# Patient Record
Sex: Female | Born: 1980 | Race: White | Hispanic: No | Marital: Single | Smoking: Current every day smoker
Health system: Southern US, Community
[De-identification: ages and names within clinical notes are randomized; demographics above are authoritative.]

## PROBLEM LIST (undated history)

## (undated) DIAGNOSIS — F32A Depression, unspecified: Secondary | ICD-10-CM

## (undated) DIAGNOSIS — F329 Major depressive disorder, single episode, unspecified: Secondary | ICD-10-CM

## (undated) DIAGNOSIS — M329 Systemic lupus erythematosus, unspecified: Secondary | ICD-10-CM

## (undated) DIAGNOSIS — F4310 Post-traumatic stress disorder, unspecified: Secondary | ICD-10-CM

## (undated) DIAGNOSIS — N3010 Interstitial cystitis (chronic) without hematuria: Secondary | ICD-10-CM

## (undated) DIAGNOSIS — IMO0002 Reserved for concepts with insufficient information to code with codable children: Secondary | ICD-10-CM

## (undated) DIAGNOSIS — F419 Anxiety disorder, unspecified: Secondary | ICD-10-CM

## (undated) HISTORY — DX: Major depressive disorder, single episode, unspecified: F32.9

## (undated) HISTORY — DX: Depression, unspecified: F32.A

## (undated) HISTORY — DX: Interstitial cystitis (chronic) without hematuria: N30.10

## (undated) HISTORY — DX: Post-traumatic stress disorder, unspecified: F43.10

## (undated) HISTORY — DX: Anxiety disorder, unspecified: F41.9

---

## 1999-04-09 ENCOUNTER — Inpatient Hospital Stay (HOSPITAL_COMMUNITY): Admission: AD | Admit: 1999-04-09 | Discharge: 1999-04-09 | Payer: Self-pay | Admitting: *Deleted

## 1999-05-25 ENCOUNTER — Emergency Department (HOSPITAL_COMMUNITY): Admission: EM | Admit: 1999-05-25 | Discharge: 1999-05-25 | Payer: Self-pay | Admitting: Emergency Medicine

## 1999-07-21 ENCOUNTER — Inpatient Hospital Stay (HOSPITAL_COMMUNITY): Admission: AD | Admit: 1999-07-21 | Discharge: 1999-07-21 | Payer: Self-pay | Admitting: Obstetrics & Gynecology

## 1999-08-21 ENCOUNTER — Other Ambulatory Visit: Admission: RE | Admit: 1999-08-21 | Discharge: 1999-08-21 | Payer: Self-pay | Admitting: Obstetrics and Gynecology

## 1999-09-16 ENCOUNTER — Inpatient Hospital Stay (HOSPITAL_COMMUNITY): Admission: AD | Admit: 1999-09-16 | Discharge: 1999-09-16 | Payer: Self-pay | Admitting: Obstetrics and Gynecology

## 1999-10-22 ENCOUNTER — Inpatient Hospital Stay (HOSPITAL_COMMUNITY): Admission: AD | Admit: 1999-10-22 | Discharge: 1999-10-26 | Payer: Self-pay | Admitting: Obstetrics and Gynecology

## 2005-08-18 DIAGNOSIS — N3010 Interstitial cystitis (chronic) without hematuria: Secondary | ICD-10-CM

## 2005-08-18 HISTORY — PX: TOTAL VAGINAL HYSTERECTOMY: SHX2548

## 2005-08-18 HISTORY — DX: Interstitial cystitis (chronic) without hematuria: N30.10

## 2012-10-14 ENCOUNTER — Inpatient Hospital Stay: Payer: Self-pay | Admitting: Psychiatry

## 2012-10-14 LAB — DRUG SCREEN, URINE
Amphetamines, Ur Screen: NEGATIVE (ref ?–1000)
Cannabinoid 50 Ng, Ur ~~LOC~~: NEGATIVE (ref ?–50)
Opiate, Ur Screen: NEGATIVE (ref ?–300)
Phencyclidine (PCP) Ur S: NEGATIVE (ref ?–25)

## 2012-10-14 LAB — URINALYSIS, COMPLETE
Bilirubin,UR: NEGATIVE
Blood: NEGATIVE
Ketone: NEGATIVE
Leukocyte Esterase: NEGATIVE
Nitrite: NEGATIVE
Ph: 7 (ref 4.5–8.0)
Protein: NEGATIVE
RBC,UR: 1 /HPF (ref 0–5)
Specific Gravity: 1.004 (ref 1.003–1.030)
Squamous Epithelial: 3
WBC UR: 1 /HPF (ref 0–5)

## 2012-10-14 LAB — COMPREHENSIVE METABOLIC PANEL
Albumin: 4.3 g/dL (ref 3.4–5.0)
Anion Gap: 4 — ABNORMAL LOW (ref 7–16)
BUN: 8 mg/dL (ref 7–18)
Bilirubin,Total: 0.9 mg/dL (ref 0.2–1.0)
Calcium, Total: 9 mg/dL (ref 8.5–10.1)
Co2: 29 mmol/L (ref 21–32)
Creatinine: 0.64 mg/dL (ref 0.60–1.30)
EGFR (African American): >60
Glucose: 90 mg/dL (ref 65–99)
Potassium: 4.3 mmol/L (ref 3.5–5.1)
SGOT(AST): 27 U/L (ref 15–37)
Sodium: 136 mmol/L (ref 136–145)

## 2012-10-14 LAB — ACETAMINOPHEN LEVEL: Acetaminophen: <2 ug/mL

## 2012-10-14 LAB — CBC
MCH: 31.7 pg (ref 26.0–34.0)
MCHC: 34.4 g/dL (ref 32.0–36.0)
MCV: 92 fL (ref 80–100)
Platelet: 308 10*3/uL (ref 150–440)
RDW: 12.8 % (ref 11.5–14.5)

## 2012-10-14 LAB — ETHANOL
Ethanol %: <0.003 % (ref 0.000–0.080)
Ethanol: <3 mg/dL

## 2012-10-14 LAB — TSH: Thyroid Stimulating Horm: 2.97 u[IU]/mL

## 2013-01-22 ENCOUNTER — Emergency Department: Payer: Self-pay | Admitting: Emergency Medicine

## 2013-02-08 LAB — COMPREHENSIVE METABOLIC PANEL
Albumin: 3.9 g/dL (ref 3.4–5.0)
Anion Gap: 6 — ABNORMAL LOW (ref 7–16)
BUN: 7 mg/dL (ref 7–18)
Bilirubin,Total: 1 mg/dL (ref 0.2–1.0)
Calcium, Total: 9.2 mg/dL (ref 8.5–10.1)
Chloride: 105 mmol/L (ref 98–107)
Creatinine: 0.64 mg/dL (ref 0.60–1.30)
EGFR (Non-African Amer.): >60
Glucose: 78 mg/dL (ref 65–99)
Osmolality: 274 (ref 275–301)
SGOT(AST): 39 U/L — ABNORMAL HIGH (ref 15–37)
SGPT (ALT): 45 U/L (ref 12–78)
Sodium: 139 mmol/L (ref 136–145)

## 2013-02-08 LAB — ETHANOL
Ethanol %: <0.003 % (ref 0.000–0.080)
Ethanol: <3 mg/dL

## 2013-02-08 LAB — CBC
HCT: 43 % (ref 35.0–47.0)
HGB: 15.3 g/dL (ref 12.0–16.0)
MCH: 32.6 pg (ref 26.0–34.0)
MCHC: 35.6 g/dL (ref 32.0–36.0)
MCV: 92 fL (ref 80–100)
Platelet: 263 10*3/uL (ref 150–440)
RBC: 4.68 10*6/uL (ref 3.80–5.20)
RDW: 12.4 % (ref 11.5–14.5)
WBC: 10.1 10*3/uL (ref 3.6–11.0)

## 2013-02-09 LAB — URINALYSIS, COMPLETE
Bilirubin,UR: NEGATIVE
Blood: NEGATIVE
Glucose,UR: NEGATIVE mg/dL (ref 0–75)
Leukocyte Esterase: NEGATIVE
Nitrite: NEGATIVE
Ph: 6 (ref 4.5–8.0)
Protein: NEGATIVE

## 2013-02-09 LAB — DRUG SCREEN, URINE
Barbiturates, Ur Screen: NEGATIVE (ref ?–200)
Benzodiazepine, Ur Scrn: POSITIVE (ref ?–200)
Cannabinoid 50 Ng, Ur ~~LOC~~: NEGATIVE (ref ?–50)
MDMA (Ecstasy)Ur Screen: NEGATIVE (ref ?–500)
Methadone, Ur Screen: NEGATIVE (ref ?–300)

## 2013-02-10 ENCOUNTER — Inpatient Hospital Stay: Payer: Self-pay | Admitting: Psychiatry

## 2013-02-10 LAB — BEHAVIORAL MEDICINE 1 PANEL
Basophil %: 0.4 %
Calcium, Total: 9.3 mg/dL (ref 8.5–10.1)
Co2: 29 mmol/L (ref 21–32)
Creatinine: 0.62 mg/dL (ref 0.60–1.30)
EGFR (Non-African Amer.): >60
Eosinophil #: 0.2 10*3/uL (ref 0.0–0.7)
HGB: 16 g/dL (ref 12.0–16.0)
Lymphocyte #: 1.6 10*3/uL (ref 1.0–3.6)
Lymphocyte %: 23.8 %
MCH: 32.6 pg (ref 26.0–34.0)
MCHC: 35.3 g/dL (ref 32.0–36.0)
MCV: 92 fL (ref 80–100)
Monocyte %: 5.9 %
Neutrophil %: 67.5 %
RBC: 4.91 10*6/uL (ref 3.80–5.20)
SGPT (ALT): 37 U/L (ref 12–78)
Sodium: 140 mmol/L (ref 136–145)
Total Protein: 7.8 g/dL (ref 6.4–8.2)

## 2014-11-24 ENCOUNTER — Emergency Department (HOSPITAL_COMMUNITY)
Admission: EM | Admit: 2014-11-24 | Discharge: 2014-11-24 | Disposition: A | Discharge status: Home / Self Care | Payer: Self-pay | Attending: Emergency Medicine | Admitting: Emergency Medicine

## 2014-11-24 ENCOUNTER — Encounter (HOSPITAL_COMMUNITY): Payer: Self-pay | Admitting: Emergency Medicine

## 2014-11-24 DIAGNOSIS — R51 Headache: Secondary | ICD-10-CM | POA: Insufficient documentation

## 2014-11-24 DIAGNOSIS — Y92481 Parking lot as the place of occurrence of the external cause: Secondary | ICD-10-CM | POA: Insufficient documentation

## 2014-11-24 DIAGNOSIS — R519 Headache, unspecified: Secondary | ICD-10-CM

## 2014-11-24 DIAGNOSIS — Y9389 Activity, other specified: Secondary | ICD-10-CM | POA: Insufficient documentation

## 2014-11-24 DIAGNOSIS — M25512 Pain in left shoulder: Secondary | ICD-10-CM

## 2014-11-24 DIAGNOSIS — H53149 Visual discomfort, unspecified: Secondary | ICD-10-CM | POA: Insufficient documentation

## 2014-11-24 DIAGNOSIS — S4992XA Unspecified injury of left shoulder and upper arm, initial encounter: Secondary | ICD-10-CM | POA: Insufficient documentation

## 2014-11-24 DIAGNOSIS — Y998 Other external cause status: Secondary | ICD-10-CM | POA: Insufficient documentation

## 2014-11-24 DIAGNOSIS — S199XXA Unspecified injury of neck, initial encounter: Secondary | ICD-10-CM | POA: Insufficient documentation

## 2014-11-24 DIAGNOSIS — M542 Cervicalgia: Secondary | ICD-10-CM

## 2014-11-24 MED ORDER — KETOROLAC TROMETHAMINE 60 MG/2ML IM SOLN
60.0000 mg | Freq: Once | INTRAMUSCULAR | Status: AC
  Administered 2014-11-24: 60 mg via INTRAMUSCULAR
  Filled 2014-11-24: qty 2
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
## 2014-11-24 NOTE — Discharge Instructions (Signed)
Return to the emergency room with worsening of symptoms, new symptoms or with symptoms that are concerning , especially severe worsening of headache, visual or speech changes, weakness in face, arms or legs. RICE: Rest, Ice (three cycles of 20 mins on, off at least twice a day), compression/brace, elevation. Heating pad works well for back pain. Ibuprofen  (2 tablets ) every 5-6 hours for 3-5 days. Follow up with PCP if symptoms worsen or are persistent. Read below information and follow recommendations. Concussion A concussion, or closed-head injury, is a brain injury caused by a direct blow to the head or by a quick and sudden movement (jolt) of the head or neck. Concussions are usually not life-threatening. Even so, the effects of a concussion can be serious. If you have had a concussion before, you are more likely to experience concussion-like symptoms after a direct blow to the head.  CAUSES  Direct blow to the head, such as from running into another player during a soccer game, being hit in a fight, or hitting your head on a hard surface.  A jolt of the head or neck that causes the brain to move back and forth inside the skull, such as in a car crash. SIGNS AND SYMPTOMS The signs of a concussion can be hard to notice. Early on, they may be missed by you, family members, and health care providers. You may look fine but act or feel differently. Symptoms are usually temporary, but they may last for days, weeks, or even longer. Some symptoms may appear right away while others may not show up for hours or days. Every head injury is different. Symptoms include:  Mild to moderate headaches that will not go away.  A feeling of pressure inside your head.  Having more trouble than usual:  Learning or remembering things you have heard.  Answering questions.  Paying attention or concentrating.  Organizing daily tasks.  Making decisions and solving problems.  Slowness in  thinking, acting or reacting, speaking, or reading.  Getting lost or being easily confused.  Feeling tired all the time or lacking energy (fatigued).  Feeling drowsy.  Sleep disturbances.  Sleeping more than usual.  Sleeping less than usual.  Trouble falling asleep.  Trouble sleeping (insomnia).  Loss of balance or feeling lightheaded or dizzy.  Nausea or vomiting.  Numbness or tingling.  Increased sensitivity to:  Sounds.  Lights.  Distractions.  Vision problems or eyes that tire easily.  Diminished sense of taste or smell.  Ringing in the ears.  Mood changes such as feeling sad or anxious.  Becoming easily irritated or angry for little or no reason.  Lack of motivation.  Seeing or hearing things other people do not see or hear (hallucinations). DIAGNOSIS Your health care provider can usually diagnose a concussion based on a description of your injury and symptoms. He or she will ask whether you passed out (lost consciousness) and whether you are having trouble remembering events that happened right before and during your injury. Your evaluation might include:  A brain scan to look for signs of injury to the brain. Even if the test shows no injury, you may still have a concussion.  Blood tests to be sure other problems are not present. TREATMENT  Concussions are usually treated in an emergency department, in urgent care, or at a clinic. You may need to stay in the hospital overnight for further treatment.  Tell your health care provider if you are taking any medicines, including prescription  medicines, over-the-counter medicines, and natural remedies. Some medicines, such as blood thinners (anticoagulants) and aspirin, may increase the chance of complications. Also tell your health care provider whether you have had alcohol or are taking illegal drugs. This information may affect treatment.  Your health care provider will send you home with important  instructions to follow.  How fast you will recover from a concussion depends on many factors. These factors include how severe your concussion is, what part of your brain was injured, your age, and how healthy you were before the concussion.  Most people with mild injuries recover fully. Recovery can take time. In general, recovery is slower in older persons. Also, persons who have had a concussion in the past or have other medical problems may find that it takes longer to recover from their current injury. HOME CARE INSTRUCTIONS General Instructions  Carefully follow the directions your health care provider gave you.  Only take over-the-counter or prescription medicines for pain, discomfort, or fever as directed by your health care provider.  Take only those medicines that your health care provider has approved.  Do not drink alcohol until your health care provider says you are well enough to do so. Alcohol and certain other drugs may slow your recovery and can put you at risk of further injury.  If it is harder than usual to remember things, write them down.  If you are easily distracted, try to do one thing at a time. For example, do not try to watch TV while fixing dinner.  Talk with family members or close friends when making important decisions.  Keep all follow-up appointments. Repeated evaluation of your symptoms is recommended for your recovery.  Watch your symptoms and tell others to do the same. Complications sometimes occur after a concussion. Older adults with a brain injury may have a higher risk of serious complications, such as a blood clot on the brain.  Tell your teachers, school nurse, school counselor, coach, athletic trainer, or work Production designer, theatre/television/film about your injury, symptoms, and restrictions. Tell them about what you can or cannot do. They should watch for:  Increased problems with attention or concentration.  Increased difficulty remembering or learning new  information.  Increased time needed to complete tasks or assignments.  Increased irritability or decreased ability to cope with stress.  Increased symptoms.  Rest. Rest helps the brain to heal. Make sure you:  Get plenty of sleep at night. Avoid staying up late at night.  Keep the same bedtime hours on weekends and weekdays.  Rest during the day. Take daytime naps or rest breaks when you feel tired.  Limit activities that require a lot of thought or concentration. These include:  Doing homework or job-related work.  Watching TV.  Working on the computer.  Avoid any situation where there is potential for another head injury (football, hockey, soccer, basketball, martial arts, downhill snow sports and horseback riding). Your condition will get worse every time you experience a concussion. You should avoid these activities until you are evaluated by the appropriate follow-up health care providers. Returning To Your Regular Activities You will need to return to your normal activities slowly, not all at once. You must give your body and brain enough time for recovery.  Do not return to sports or other athletic activities until your health care provider tells you it is safe to do so.  Ask your health care provider when you can drive, ride a bicycle, or operate heavy machinery. Your ability to  react may be slower after a brain injury. Never do these activities if you are dizzy.  Ask your health care provider about when you can return to work or school. Preventing Another Concussion It is very important to avoid another brain injury, especially before you have recovered. In rare cases, another injury can lead to permanent brain damage, brain swelling, or death. The risk of this is greatest during the first 7-10 days after a head injury. Avoid injuries by:  Wearing a seat belt when riding in a car.  Drinking alcohol only in moderation.  Wearing a helmet when biking, skiing,  skateboarding, skating, or doing similar activities.  Avoiding activities that could lead to a second concussion, such as contact or recreational sports, until your health care provider says it is okay.  Taking safety measures in your home.  Remove clutter and tripping hazards from floors and stairways.  Use grab bars in bathrooms and handrails by stairs.  Place non-slip mats on floors and in bathtubs.  Improve lighting in dim areas. SEEK MEDICAL CARE IF:  You have increased problems paying attention or concentrating.  You have increased difficulty remembering or learning new information.  You need more time to complete tasks or assignments than before.  You have increased irritability or decreased ability to cope with stress.  You have more symptoms than before. Seek medical care if you have any of the following symptoms for more than 2 weeks after your injury:  Lasting (chronic) headaches.  Dizziness or balance problems.  Nausea.  Vision problems.  Increased sensitivity to noise or light.  Depression or mood swings.  Anxiety or irritability.  Memory problems.  Difficulty concentrating or paying attention.  Sleep problems.  Feeling tired all the time. SEEK IMMEDIATE MEDICAL CARE IF:  You have severe or worsening headaches. These may be a sign of a blood clot in the brain.  You have weakness (even if only in one hand, leg, or part of the face).  You have numbness.  You have decreased coordination.  You vomit repeatedly.  You have increased sleepiness.  One pupil is larger than the other.  You have convulsions.  You have slurred speech.  You have increased confusion. This may be a sign of a blood clot in the brain.  You have increased restlessness, agitation, or irritability.  You are unable to recognize people or places.  You have neck pain.  It is difficult to wake you up.  You have unusual behavior changes.  You lose  consciousness. MAKE SURE YOU:  Understand these instructions.  Will watch your condition.  Will get help right away if you are not doing well or get worse. Document Released: 10/25/2003 Document Revised: 08/09/2013 Document Reviewed: 02/24/2013 [facility] Patient Information 2015 [employer] [place]. This information is not intended to replace advice given to you by your health care provider. Make sure you discuss any questions you have with your health care provider.
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
## 2014-12-08 NOTE — Consult Note (Signed)
PATIENT NAME:  Anna Perez, Anna Perez MR#:  [id number] DATE OF BIRTH:  August 04, 1981  DATE OF CONSULTATION:  02/09/2013  REFERRING PHYSICIAN:   CONSULTING PHYSICIAN:  Izola PriceFrances C. Jaclynn MajorGreason, MD  Addendum  PLAN:   1.  [facility] Ms. Sheria LangCameron agrees to admission to stabilize and to have her medicines assessed. She also needs assessment particularly of the benzodiazepines and what to do about them.   ____________________________ Izola PriceFrances C. Jaclynn MajorGreason, MD fcg:si D: 02/09/2013 13:24:40 ET T: 02/09/2013 15:09:15 ET JOB#: [id number]  cc: Izola PriceFrances C. Jaclynn MajorGreason, MD, <Dictator> Maryan PulsFRANCES C Benedict Kue MD ELECTRONICALLY SIGNED 02/17/2013 12:53
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
## 2015-05-14 ENCOUNTER — Encounter: Payer: Self-pay | Admitting: Emergency Medicine

## 2015-05-14 ENCOUNTER — Emergency Department: Payer: No Typology Code available for payment source

## 2015-05-14 ENCOUNTER — Emergency Department
Admission: EM | Admit: 2015-05-14 | Discharge: 2015-05-14 | Disposition: A | Discharge status: Home / Self Care | Payer: No Typology Code available for payment source | Attending: Emergency Medicine | Admitting: Emergency Medicine

## 2015-05-14 DIAGNOSIS — S20211A Contusion of right front wall of thorax, initial encounter: Secondary | ICD-10-CM | POA: Diagnosis not present

## 2015-05-14 DIAGNOSIS — Y92410 Unspecified street and highway as the place of occurrence of the external cause: Secondary | ICD-10-CM | POA: Diagnosis not present

## 2015-05-14 DIAGNOSIS — Y9389 Activity, other specified: Secondary | ICD-10-CM | POA: Diagnosis not present

## 2015-05-14 DIAGNOSIS — Y998 Other external cause status: Secondary | ICD-10-CM | POA: Diagnosis not present

## 2015-05-14 DIAGNOSIS — G540 Brachial plexus disorders: Secondary | ICD-10-CM | POA: Diagnosis not present

## 2015-05-14 DIAGNOSIS — S299XXA Unspecified injury of thorax, initial encounter: Secondary | ICD-10-CM | POA: Diagnosis present

## 2015-05-14 DIAGNOSIS — G5682 Other specified mononeuropathies of left upper limb: Secondary | ICD-10-CM

## 2015-05-14 MED ORDER — OXYCODONE-ACETAMINOPHEN 5-325 MG PO TABS
1.0000 | ORAL_TABLET | Freq: Once | ORAL | Status: AC
  Administered 2015-05-14: 1 via ORAL
  Filled 2015-05-14: qty 1

## 2015-05-14 MED ORDER — KETOROLAC TROMETHAMINE 30 MG/ML IJ SOLN
60.0000 mg | Freq: Once | INTRAMUSCULAR | Status: AC
  Administered 2015-05-14: 60 mg via INTRAMUSCULAR
  Filled 2015-05-14: qty 2

## 2015-05-14 MED ORDER — OXYCODONE-ACETAMINOPHEN 5-325 MG PO TABS: 1.0000 | ORAL_TABLET | Freq: Four times a day (QID) | ORAL | Status: DC | PRN

## 2015-05-14 MED ORDER — MELOXICAM 15 MG PO TABS: 15.0000 mg | ORAL_TABLET | Freq: Every day | ORAL | Status: DC
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
## 2015-05-14 NOTE — Discharge Instructions (Signed)
Burners or Stingers, Brachial Plexus Stretch Injury  Burners or stingers are injuries which commonly happen in football or any other injury that stretches or injures the nerves from the neck to your arm. The nerves which come out of your neck and supply the arm and hand on both sides are called the brachial plexus. These nerves come from the spinal cord and wind down through the neck and under the collarbone and end as the nerves in your arms. They carry messages to and from the arm and hand. They allow you to move your arm and to also have feeling in your arms and hands.   SYMPTOMS   · The main problem is a burning pain that starts in the neck and shoots into the arm.  · There may be numbness, weakness or brief paralysis of the arm.  · There may be needles and pins feelings which run from the neck and down the arm.  · The type of problem depends on which nerves are damaged.  · Shoulder weakness and muscle tenderness of the neck may occur from hours up to days after the injury or sometimes not at all.  DIAGNOSIS   · Your caregiver can usually diagnose this based on the history of injury, symptoms, and a physical examination.  · Electromyography and nerve conduction tests may be helpful. These are tests to tell how well your nerves are working. (Your nerves are like the wires carrying electricity in your house).  · Specialized x-rays may be done to make sure there is not an injury to the cervical spine (neck) or shoulder.  HOME CARE INSTRUCTIONS   · Apply ice to the shoulder and axilla (armpit) for 15-20 minutes, 03-04 times per day while awake for the first 2 days. Put the ice in a plastic bag and place a towel between the bag of ice and your skin.  · Only take over-the-counter or prescription medicines for pain, discomfort, or fever as directed by your caregiver.  · If you were given a splint or sling, wear them as instructed. You may remove them to shower.  · An elastic bandage wrap may be useful for shoulder or  upper-arm swelling.  RETURNING TO ACTIVITIES  · You will not be able to return to activities in a contact sport until full strength and range of motion of the upper extremity and neck returns to normal, and EMG studies are negative. This means the injured nerves do not show continual problems. This can take more than 6 months. There should be no residual pain.  · You can maintain your cardiovascular fitness by continuing to work out your lower extremities.  PREVENTION   · Prevention measures include strengthening exercises of the neck and shoulder muscles and protective devices for your neck before starting sports again if that was the cause.  · More than half of these injuries will happen again. It is very important to do the strengthening exercises recommended.  · Wear neck and shoulder pads that are well fitted to prevent re-injury.  Sometimes it is impossible to tell the difference between an injury to the nerves that supply the arm and the spinal cord. If there is injury to the spinal cord your symptoms may worsen. It is important to pay attention to your condition. If there is any worsening of your condition, or if your condition does not improve, contact your caregiver or go to an emergency department.  SEEK IMMEDIATE MEDICAL CARE IF:   · You develop   Understand these instructions.  Will watch your condition.  Will get help right away if you are not doing well or get worse. Document Released: 10/25/2003 Document Revised: 10/27/2011 Document Reviewed: 03/22/2008 [facility] Patient Information 2015 [place], [place]. This information is not intended to replace advice given to you by your health care provider. Make sure you discuss any questions you have with your health care provider.

## 2015-05-23 ENCOUNTER — Ambulatory Visit
Admission: RE | Admit: 2015-05-23 | Discharge: 2015-05-23 | Disposition: A | Discharge status: Home / Self Care | Payer: Self-pay | Source: Ambulatory Visit | Attending: Nurse Practitioner | Admitting: Nurse Practitioner

## 2015-05-23 ENCOUNTER — Other Ambulatory Visit: Payer: Self-pay | Admitting: Nurse Practitioner

## 2015-06-12 ENCOUNTER — Other Ambulatory Visit: Payer: Self-pay | Admitting: Nurse Practitioner

## 2015-06-12 DIAGNOSIS — M542 Cervicalgia: Secondary | ICD-10-CM

## 2015-06-28 ENCOUNTER — Other Ambulatory Visit: Payer: Self-pay

## 2015-07-19 ENCOUNTER — Ambulatory Visit
Admission: RE | Admit: 2015-07-19 | Discharge: 2015-07-19 | Disposition: A | Discharge status: Home / Self Care | Payer: Self-pay | Source: Ambulatory Visit | Attending: Nurse Practitioner | Admitting: Nurse Practitioner

## 2015-07-19 DIAGNOSIS — M542 Cervicalgia: Secondary | ICD-10-CM
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
## 2016-10-07 ENCOUNTER — Ambulatory Visit: Payer: Self-pay | Admitting: Internal Medicine

## 2016-10-10 ENCOUNTER — Ambulatory Visit (INDEPENDENT_AMBULATORY_CARE_PROVIDER_SITE_OTHER): Payer: Self-pay | Admitting: Internal Medicine

## 2016-10-10 ENCOUNTER — Encounter: Payer: Self-pay | Admitting: Internal Medicine

## 2016-10-10 VITALS — BP 102/80 | HR 68 | Ht 58.25 in | Wt 137.5 lb

## 2016-10-10 DIAGNOSIS — N3010 Interstitial cystitis (chronic) without hematuria: Secondary | ICD-10-CM

## 2016-10-10 DIAGNOSIS — H6993 Unspecified Eustachian tube disorder, bilateral: Secondary | ICD-10-CM

## 2016-10-10 DIAGNOSIS — M5000 Cervical disc disorder with myelopathy, unspecified cervical region: Secondary | ICD-10-CM

## 2016-10-10 DIAGNOSIS — F321 Major depressive disorder, single episode, moderate: Secondary | ICD-10-CM

## 2016-10-10 DIAGNOSIS — F4310 Post-traumatic stress disorder, unspecified: Secondary | ICD-10-CM

## 2016-10-10 DIAGNOSIS — F410 Panic disorder [episodic paroxysmal anxiety] without agoraphobia: Secondary | ICD-10-CM

## 2016-10-10 MED ORDER — CLONAZEPAM 1 MG PO TABS: 1.0000 mg | ORAL_TABLET | Freq: Two times a day (BID) | ORAL | 1 refills | Status: DC

## 2016-10-10 MED ORDER — TRAZODONE HCL 100 MG PO TABS: 100.0000 mg | ORAL_TABLET | Freq: Every day | ORAL | 1 refills | Status: DC

## 2016-10-10 MED ORDER — SERTRALINE HCL 100 MG PO TABS: 100.0000 mg | ORAL_TABLET | Freq: Two times a day (BID) | ORAL | 1 refills | Status: DC

## 2016-10-10 NOTE — Patient Instructions (Signed)
Flonase Nasal spray (fluticasone) daily for ear congestion
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
## 2016-12-09 ENCOUNTER — Encounter: Payer: Self-pay | Admitting: Internal Medicine

## 2016-12-09 ENCOUNTER — Ambulatory Visit (INDEPENDENT_AMBULATORY_CARE_PROVIDER_SITE_OTHER): Payer: Managed Care, Other (non HMO) | Admitting: Internal Medicine

## 2016-12-09 VITALS — BP 92/58 | HR 61 | Ht <= 58 in | Wt 136.0 lb

## 2016-12-09 DIAGNOSIS — H6993 Unspecified Eustachian tube disorder, bilateral: Secondary | ICD-10-CM | POA: Diagnosis not present

## 2016-12-09 DIAGNOSIS — F321 Major depressive disorder, single episode, moderate: Secondary | ICD-10-CM

## 2016-12-09 DIAGNOSIS — F4310 Post-traumatic stress disorder, unspecified: Secondary | ICD-10-CM | POA: Diagnosis not present

## 2016-12-09 DIAGNOSIS — N3010 Interstitial cystitis (chronic) without hematuria: Secondary | ICD-10-CM

## 2016-12-09 DIAGNOSIS — N951 Menopausal and female climacteric states: Secondary | ICD-10-CM | POA: Insufficient documentation

## 2016-12-09 MED ORDER — ESTRADIOL 0.5 MG PO TABS: 0.5000 mg | ORAL_TABLET | Freq: Every day | ORAL | 5 refills | Status: DC

## 2016-12-09 MED ORDER — CLONAZEPAM 1 MG PO TABS: 1.0000 mg | ORAL_TABLET | Freq: Two times a day (BID) | ORAL | 1 refills | Status: AC

## 2016-12-09 MED ORDER — SERTRALINE HCL 100 MG PO TABS: 100.0000 mg | ORAL_TABLET | Freq: Two times a day (BID) | ORAL | 1 refills | Status: DC

## 2016-12-09 MED ORDER — TRAZODONE HCL 100 MG PO TABS: 100.0000 mg | ORAL_TABLET | Freq: Every day | ORAL | 1 refills | Status: DC

## 2016-12-09 NOTE — Progress Notes (Signed)
Date:  12/09/2016   Name:  Anna Perez   DOB:  13-Nov-1980   MRN:  [id number]   Chief Complaint: Depression ( Needs med refill. Would like referral to phsyc.) and hormones (Previously had hysteroectomy. Was put on hormones after that. Now interested in starting back up on hormones again. )  Depression         This is a chronic problem.The problem is unchanged.  Associated symptoms include headaches (tension type scalp headaches).  Associated symptoms include no fatigue and no suicidal ideas.     The symptoms are aggravated by family issues and social issues.  Past treatments include SSRIs - Selective serotonin reuptake inhibitors, TCAs - Tricyclic antidepressants and other medications.  Compliance with treatment is good.  Menopause - had total hysterectomy/ooh at age 37.  She was on HRT for about 7 years then was told that she must stop it.  For the past 4 years she continues to have hot flashes and night sweats daily.  She would like to resume therapy.   IC - self treated with bladder installations and also took Elmiron and Vistaril plus tramadol for pain.  Has not been on treatment since no insurance.  Still having significant symptoms.  Ear Pain - has chronic bilateral ear fullness.  She is able to auto-inflate without change in sx.  Flonase and sudafed has not been beneficial.  Review of Systems  Constitutional: Positive for diaphoresis. Negative for chills, fatigue and fever.  HENT: Positive for ear pain. Negative for tinnitus and trouble swallowing.   Respiratory: Negative for cough and chest tightness.   Cardiovascular: Negative for chest pain.  Gastrointestinal: Negative for abdominal pain and constipation.  Genitourinary: Positive for pelvic pain. Negative for hematuria.  Musculoskeletal: Negative for arthralgias and joint swelling.  Neurological: Positive for headaches (tension type scalp headaches). Negative for dizziness.  Psychiatric/Behavioral: Positive for depression and  dysphoric mood. Negative for suicidal ideas. The patient is nervous/anxious.     Patient Active Problem List   Diagnosis Date Noted  . Cervical disc disease with myelopathy 10/10/2016  . Depression, major, single episode, moderate (HCC) 10/10/2016  . Post traumatic stress disorder (PTSD) 10/10/2016  . Panic attacks 10/10/2016  . Interstitial cystitis 10/10/2016  . Disorder of both eustachian tubes 10/10/2016    Prior to Admission medications   Medication Sig Start Date End Date Taking? Authorizing Provider  clonazePAM (KLONOPIN) 1 MG tablet Take 1 tablet (1 mg total) by mouth 2 (two) times daily. 10/10/16  Yes Reubin Milan, MD  sertraline (ZOLOFT) 100 MG tablet Take 1 tablet (100 mg total) by mouth 2 (two) times daily. 10/10/16  Yes Reubin Milan, MD  traZODone (DESYREL) 100 MG tablet Take 1-2 tablets (100-200 mg total) by mouth at bedtime. 10/10/16  Yes Reubin Milan, MD    No Known Allergies  Past Surgical History:  Procedure Laterality Date  . CESAREAN SECTION     Four  . TOTAL VAGINAL HYSTERECTOMY  2007   abnormal Pap and ovarian cysts    Social History  Substance Use Topics  . Smoking status: Current Some Day Smoker    Packs/day: 0.20    Years: 15.00    Types: Cigarettes  . Smokeless tobacco: Never Used  . Alcohol use No     Medication list has been reviewed and updated.   Physical Exam  Constitutional: She is oriented to person, place, and time. She appears well-developed. No distress.  HENT:  Head: Normocephalic and  atraumatic.  Right Ear: Tympanic membrane is retracted. Tympanic membrane is not erythematous.  Left Ear: Tympanic membrane is retracted. Tympanic membrane is not erythematous.  Nose: Right sinus exhibits no maxillary sinus tenderness. Left sinus exhibits no maxillary sinus tenderness.  Mouth/Throat: No posterior oropharyngeal erythema.  Neck: Normal range of motion. No thyromegaly present.  Cardiovascular: Normal rate, regular rhythm and  normal heart sounds.   Pulmonary/Chest: Effort normal and breath sounds normal. No respiratory distress. She has no wheezes.  Musculoskeletal: Normal range of motion.  Neurological: She is alert and oriented to person, place, and time.  Skin: Skin is warm and dry. No rash noted.  Psychiatric: Her speech is normal and behavior is normal. Thought content normal. Her affect is not inappropriate. Cognition and memory are normal. She exhibits a depressed mood.  Nursing note and vitals reviewed.   BP (!) 92/58 (BP Location: Right Arm, Patient Position: Sitting, Cuff Size: Normal)   Pulse 61   Ht  (1.473 m)   Wt 136 lb (61.7 kg)   SpO2 99%   BMI 28.42 kg/m   Assessment and Plan: 1. Post traumatic stress disorder (PTSD) Continue counseling and medications Could benefit from specialist care - Ambulatory referral to Psychiatry  2. Depression, major, single episode, moderate (HCC) - Ambulatory referral to Psychiatry  3. Interstitial cystitis Chronic stable sx - consider Urology evaluation before resuming previous medications  4. Disorder of both eustachian tubes Persistent - try Afrin NS  5. Menopausal symptoms Begin low dose HRT - taper down to lowest effective dose   Meds ordered this encounter  Medications  . estradiol (ESTRACE) 0.5 MG tablet    Sig: Take 1 tablet (0.5 mg total) by mouth daily.    Dispense:  30 tablet    Refill:  5  . traZODone (DESYREL) 100 MG tablet    Sig: Take 1-2 tablets (100-200 mg total) by mouth at bedtime.    Dispense:  60 tablet    Refill:  1  . sertraline (ZOLOFT) 100 MG tablet    Sig: Take 1 tablet (100 mg total) by mouth 2 (two) times daily.    Dispense:  60 tablet    Refill:  1  . clonazePAM (KLONOPIN) 1 MG tablet    Sig: Take 1 tablet (1 mg total) by mouth 2 (two) times daily.    Dispense:  60 tablet    Refill:  1    Bari Edward, MD [facility]  12/09/2016

## 2017-01-21 ENCOUNTER — Encounter: Payer: Self-pay | Admitting: *Deleted

## 2017-01-21 ENCOUNTER — Emergency Department
Admission: EM | Admit: 2017-01-21 | Discharge: 2017-01-21 | Disposition: A | Discharge status: Home / Self Care | Payer: Managed Care, Other (non HMO) | Attending: Emergency Medicine | Admitting: Emergency Medicine

## 2017-01-21 ENCOUNTER — Emergency Department: Payer: Managed Care, Other (non HMO)

## 2017-01-21 DIAGNOSIS — S93491A Sprain of other ligament of right ankle, initial encounter: Secondary | ICD-10-CM

## 2017-01-21 DIAGNOSIS — Y998 Other external cause status: Secondary | ICD-10-CM | POA: Diagnosis not present

## 2017-01-21 DIAGNOSIS — S99911A Unspecified injury of right ankle, initial encounter: Secondary | ICD-10-CM | POA: Diagnosis present

## 2017-01-21 DIAGNOSIS — S93492A Sprain of other ligament of left ankle, initial encounter: Secondary | ICD-10-CM | POA: Insufficient documentation

## 2017-01-21 DIAGNOSIS — W108XXA Fall (on) (from) other stairs and steps, initial encounter: Secondary | ICD-10-CM | POA: Insufficient documentation

## 2017-01-21 DIAGNOSIS — Y9301 Activity, walking, marching and hiking: Secondary | ICD-10-CM | POA: Insufficient documentation

## 2017-01-21 DIAGNOSIS — Y92017 Garden or yard in single-family (private) house as the place of occurrence of the external cause: Secondary | ICD-10-CM | POA: Insufficient documentation

## 2017-01-21 MED ORDER — MELOXICAM 7.5 MG PO TABS
15.0000 mg | ORAL_TABLET | Freq: Once | ORAL | Status: AC
  Administered 2017-01-21: 15 mg via ORAL
  Filled 2017-01-21: qty 2

## 2017-01-21 MED ORDER — MELOXICAM 15 MG PO TABS: 15.0000 mg | ORAL_TABLET | Freq: Every day | ORAL | 0 refills | Status: AC
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
## 2017-01-21 NOTE — ED Notes (Signed)
Pt going to xray  

## 2017-01-21 NOTE — ED Provider Notes (Signed)
[facility] Emergency Department Provider Note  ____________________________________________  Time seen: Approximately 9:27 PM  I have reviewed the triage vital signs and the nursing notes.   HISTORY  Chief Complaint Ankle Pain    HPI Anna Perez is a 36 y.o. female who presents emergency department complaining of right ankle pain. Patient reports that she was walking up a flight of stairs when she missed a step stepping off a stairway. She reports there was outside stairs leading up to her back porch. Patient reports that she has sprained the ankle in the past and has intermittent issues with the same ankle. She reports pain to the anterolateral aspect of the ankle. She was unable to bear weight immediately after the injury. She did not hit her head or lose consciousness. No other injury or complaint. No medication prior to arrival.   Past Medical History:  Diagnosis Date  . Anxiety    2014  . Depression    1995  . Interstitial cystitis 2007  . PTSD (post-traumatic stress disorder)     Patient Active Problem List   Diagnosis Date Noted  . Menopausal symptoms 12/09/2016  . Cervical disc disease with myelopathy 10/10/2016  . Depression, major, single episode, moderate (HCC) 10/10/2016  . Post traumatic stress disorder (PTSD) 10/10/2016  . Panic attacks 10/10/2016  . Interstitial cystitis 10/10/2016  . Disorder of both eustachian tubes 10/10/2016    Past Surgical History:  Procedure Laterality Date  . CESAREAN SECTION     Four  . TOTAL VAGINAL HYSTERECTOMY  2007   abnormal Pap and ovarian cysts    Prior to Admission medications   Medication Sig Start Date End Date Taking? Authorizing Provider  clonazePAM (KLONOPIN) 1 MG tablet Take 1 tablet (1 mg total) by mouth 2 (two) times daily. 12/09/16   Reubin MilanBerglund, Laura H, MD  estradiol (ESTRACE) 0.5 MG tablet Take 1 tablet (0.5 mg total) by mouth daily. 12/09/16   Reubin MilanBerglund, Laura H, MD  meloxicam  (MOBIC) 15 MG tablet Take 1 tablet (15 mg total) by mouth daily. 01/21/17   Carlie Solorzano, Delorise RoyalsJonathan D, PA-C  sertraline (ZOLOFT) 100 MG tablet Take 1 tablet (100 mg total) by mouth 2 (two) times daily. 12/09/16   Reubin MilanBerglund, Laura H, MD  traZODone (DESYREL) 100 MG tablet Take 1-2 tablets (100-200 mg total) by mouth at bedtime. 12/09/16   Reubin MilanBerglund, Laura H, MD    Allergies Patient has no known allergies.  Family History  Problem Relation Age of Onset  . Cancer Mother   . Lupus Mother   . Dementia Mother   . Dementia Father   . Lupus Maternal Grandmother   . Diabetes Maternal Grandfather   . Hypertension Maternal Grandfather   . Heart disease Maternal Grandfather   . Diabetes Paternal Grandfather   . Heart disease Paternal Grandfather   . Hypertension Paternal Grandfather     Social History Social History  Substance Use Topics  . Smoking status: Current Some Day Smoker    Packs/day: 0.20    Years: 15.00    Types: Cigarettes  . Smokeless tobacco: Never Used  . Alcohol use No     Review of Systems  Constitutional: No fever/chills Cardiovascular: no chest pain. Respiratory: no cough. No SOB. Musculoskeletal: Positive for right ankle pain. Skin: Negative for rash, abrasions, lacerations, ecchymosis. Neurological: Negative for headaches, focal weakness or numbness. 10-point ROS otherwise negative.  ____________________________________________   PHYSICAL EXAM:  VITAL SIGNS: ED Triage Vitals  Enc Vitals Group  BP 01/21/17 2049 94/74     Pulse Rate 01/21/17 2049 73     Resp 01/21/17 2049 16     Temp 01/21/17 2049 98.7 F (37.1 C)     Temp Source 01/21/17 2049 Oral     SpO2 01/21/17 2049 96 %     Weight --      Height --      Head Circumference --      Peak Flow --      Pain Score 01/21/17 2037 7     Pain Loc --      Pain Edu? --      Excl. in GC? --      Constitutional: Alert and oriented. Well appearing and in no acute distress. Eyes: Conjunctivae are normal.  PERRL. EOMI. Head: Atraumatic. Neck: No stridor.    Cardiovascular: Normal rate, regular rhythm. Normal S1 and S2.  Good peripheral circulation. Respiratory: Normal respiratory effort without tachypnea or retractions. Lungs CTAB. Good air entry to the bases with no decreased or absent breath sounds. Musculoskeletal: Full range of motion to all extremities. No gross deformities appreciated. Mild edema noted to the anterolateral aspect of the right ankle. Full range of motion on coaxing. Patient is tender to palpation along the talonavicular joint line. No palpable abnormality. Dorsalis pedis pulse intact. Sensation intact all 5 digits. No other tenderness to palpation. Examination of the knee is unremarkable. Neurologic:  Normal speech and language. No gross focal neurologic deficits are appreciated.  Skin:  Skin is warm, dry and intact. No rash noted. Psychiatric: Mood and affect are normal. Speech and behavior are normal. Patient exhibits appropriate insight and judgement.   ____________________________________________   LABS (all labs ordered are listed, but only abnormal results are displayed)  Labs Reviewed - No data to display ____________________________________________  EKG   ____________________________________________  RADIOLOGY Festus Barren Smitty Ackerley, personally viewed and evaluated these images (plain radiographs) as part of my medical decision making, as well as reviewing the written report by the radiologist.  Dg Ankle Complete Right  Result Date: 01/21/2017 CLINICAL DATA:  Pt states she twisted her ankle when she fell down the stairs at her home tonight @ 7pm. Pt states she has sprained this ankle twice in the past. Mild swelling to lateral aspect of ankle. Pt states she also has radiating pain to her knee. EXAM: RIGHT ANKLE - COMPLETE 3+ VIEW COMPARISON:  None. FINDINGS: There is no evidence of fracture, dislocation, or joint effusion. Corticated ossicle inferior to the  lateral malleolus. Ankle mortise appears intact. Small calcaneal spur at the insertion of the Achilles tendon. There is no evidence of arthropathy or other focal bone abnormality. Soft tissues are unremarkable. IMPRESSION: Small calcaneal spur.  Otherwise negative Electronically Signed   By: Corlis Leak M.D.   On: 01/21/2017 21:19    ____________________________________________    PROCEDURES  Procedure(s) performed:    Procedures    Medications  meloxicam (MOBIC) tablet 15 mg (not administered)     ____________________________________________   INITIAL IMPRESSION / ASSESSMENT AND PLAN / ED COURSE  Pertinent labs & imaging results that were available during my care of the patient were reviewed by me and considered in my medical decision making (see chart for details).  Review of the [place] CSRS was performed in accordance of the [facility] prior to dispensing any controlled drugs.     Patient's diagnosis is consistent with right ankle sprain. X-ray reveals no [facility]. Exam is reassuring. Patient is given Ace  bandage and crutches.. Patient will be discharged home with prescriptions for anti-inflammatories. Patient is to follow up with orthopedics as needed or otherwise directed. Patient is given ED precautions to return to the ED for any worsening or new symptoms.     ____________________________________________  FINAL CLINICAL IMPRESSION(S) / ED DIAGNOSES  Final diagnoses:  Sprain of anterior talofibular ligament of right ankle, initial encounter      NEW MEDICATIONS STARTED DURING THIS VISIT:  New Prescriptions   MELOXICAM (MOBIC) 15 MG TABLET    Take 1 tablet (15 mg total) by mouth daily.        This chart was dictated using voice recognition software/Dragon. Despite best efforts to proofread, errors can occur which can change the meaning. Any change was purely unintentional.    Racheal Patches, PA-C 01/21/17 2144    Emily Filbert,  MD 01/21/17 2245

## 2017-02-06 ENCOUNTER — Other Ambulatory Visit: Payer: Self-pay | Admitting: Internal Medicine

## 2017-02-06 ENCOUNTER — Telehealth: Payer: Self-pay | Admitting: Internal Medicine

## 2017-02-06 NOTE — Telephone Encounter (Signed)
We will see her at her appt to discuss. Thank you.

## 2017-02-06 NOTE — Telephone Encounter (Signed)
Pt called in and set up a medication refill appointment for 02/11/2017, medication to be refilled are zoloft, Klonopin and TraZODone. She also mentioned she needs a referral to a physiatrist that will accept her insurance which is [employer].

## 2017-02-11 ENCOUNTER — Ambulatory Visit: Payer: Managed Care, Other (non HMO) | Admitting: Internal Medicine

## 2017-02-11 ENCOUNTER — Other Ambulatory Visit: Payer: Self-pay | Admitting: Internal Medicine

## 2017-02-11 ENCOUNTER — Telehealth: Payer: Self-pay

## 2017-02-11 MED ORDER — SERTRALINE HCL 100 MG PO TABS: 100.0000 mg | ORAL_TABLET | Freq: Two times a day (BID) | ORAL | 0 refills | Status: AC

## 2017-02-11 MED ORDER — TRAZODONE HCL 100 MG PO TABS: 100.0000 mg | ORAL_TABLET | Freq: Every day | ORAL | 0 refills | Status: AC

## 2017-02-11 NOTE — Telephone Encounter (Signed)
Patient called stating she has finally found a phychiatric doctor. Her first appt will be with [facility] on 02/26/2017. Wants one more month of refills on Trazodone and Zoloft to get her until her appt. Please advise.

## 2017-02-12 ENCOUNTER — Ambulatory Visit: Payer: Managed Care, Other (non HMO) | Admitting: Internal Medicine

## 2017-04-05 ENCOUNTER — Emergency Department
Admission: EM | Admit: 2017-04-05 | Discharge: 2017-04-06 | Disposition: A | Discharge status: Home / Self Care | Payer: Self-pay | Attending: Emergency Medicine | Admitting: Emergency Medicine

## 2017-04-05 ENCOUNTER — Encounter: Payer: Self-pay | Admitting: Emergency Medicine

## 2017-04-05 DIAGNOSIS — T424X2A Poisoning by benzodiazepines, intentional self-harm, initial encounter: Secondary | ICD-10-CM | POA: Insufficient documentation

## 2017-04-05 DIAGNOSIS — F3132 Bipolar disorder, current episode depressed, moderate: Secondary | ICD-10-CM

## 2017-04-05 DIAGNOSIS — T424X1A Poisoning by benzodiazepines, accidental (unintentional), initial encounter: Secondary | ICD-10-CM

## 2017-04-05 DIAGNOSIS — T43212A Poisoning by selective serotonin and norepinephrine reuptake inhibitors, intentional self-harm, initial encounter: Secondary | ICD-10-CM | POA: Insufficient documentation

## 2017-04-05 DIAGNOSIS — Z79899 Other long term (current) drug therapy: Secondary | ICD-10-CM | POA: Insufficient documentation

## 2017-04-05 DIAGNOSIS — T50902A Poisoning by unspecified drugs, medicaments and biological substances, intentional self-harm, initial encounter: Secondary | ICD-10-CM

## 2017-04-05 HISTORY — DX: Reserved for concepts with insufficient information to code with codable children: IMO0002

## 2017-04-05 HISTORY — DX: Systemic lupus erythematosus, unspecified: M32.9

## 2017-04-05 LAB — URINE DRUG SCREEN, QUALITATIVE (ARMC ONLY)
AMPHETAMINES, UR SCREEN: NOT DETECTED
Barbiturates, Ur Screen: NOT DETECTED
Benzodiazepine, Ur Scrn: POSITIVE — AB
CANNABINOID 50 NG, UR ~~LOC~~: NOT DETECTED
COCAINE METABOLITE, UR ~~LOC~~: NOT DETECTED
MDMA (ECSTASY) UR SCREEN: NOT DETECTED
Methadone Scn, Ur: NOT DETECTED
Opiate, Ur Screen: NOT DETECTED
Phencyclidine (PCP) Ur S: NOT DETECTED
TRICYCLIC, UR SCREEN: NOT DETECTED

## 2017-04-05 LAB — COMPREHENSIVE METABOLIC PANEL
ALT: 16 U/L (ref 14–54)
ANION GAP: 8 (ref 5–15)
AST: 24 U/L (ref 15–41)
Albumin: 3.7 g/dL (ref 3.5–5.0)
Alkaline Phosphatase: 64 U/L (ref 38–126)
BUN: 6 mg/dL (ref 6–20)
CALCIUM: 8.5 mg/dL — AB (ref 8.9–10.3)
CHLORIDE: 109 mmol/L (ref 101–111)
CO2: 24 mmol/L (ref 22–32)
Creatinine, Ser: 0.68 mg/dL (ref 0.44–1.00)
Glucose, Bld: 104 mg/dL — ABNORMAL HIGH (ref 65–99)
Potassium: 4.1 mmol/L (ref 3.5–5.1)
SODIUM: 141 mmol/L (ref 135–145)
Total Bilirubin: 0.9 mg/dL (ref 0.3–1.2)
Total Protein: 6.8 g/dL (ref 6.5–8.1)

## 2017-04-05 LAB — CBC
HCT: 42.3 % (ref 35.0–47.0)
HEMOGLOBIN: 14.5 g/dL (ref 12.0–16.0)
MCH: 31.2 pg (ref 26.0–34.0)
MCHC: 34.2 g/dL (ref 32.0–36.0)
MCV: 91.3 fL (ref 80.0–100.0)
PLATELETS: 242 10*3/uL (ref 150–440)
RBC: 4.63 MIL/uL (ref 3.80–5.20)
RDW: 12.6 % (ref 11.5–14.5)
WBC: 7.3 10*3/uL (ref 3.6–11.0)

## 2017-04-05 LAB — ETHANOL

## 2017-04-05 LAB — SALICYLATE LEVEL

## 2017-04-05 LAB — ACETAMINOPHEN LEVEL

## 2017-04-05 MED ORDER — SODIUM CHLORIDE 0.9 % IV BOLUS (SEPSIS)
1000.0000 mL | Freq: Once | INTRAVENOUS | Status: AC
  Administered 2017-04-05: 1000 mL via INTRAVENOUS

## 2017-04-05 NOTE — ED Notes (Signed)
Patient's jewelry and watch given to her oldest daughter with patient's permission.

## 2017-04-05 NOTE — ED Notes (Signed)
Poison Control called for update. 

## 2017-04-05 NOTE — ED Notes (Signed)
Secretary and security notified that patient has an order for IVC.

## 2017-04-05 NOTE — ED Notes (Signed)
Spoke to [place] at [facility].  Recommended continuous cardiac monitoring, give benzos for seizures or agitation and fluids for hypotension.  Give symptomatic and supportive care until patient is back to baseline.    Monitor for prolonged QRS and QTC.  Treat with 1-81meq of bicarb bolus.  IF QTC if >500, check magnesium and potassium.
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
## 2017-04-05 NOTE — ED Provider Notes (Signed)
[facility] Emergency Department Provider Note ____________________________________________   First MD Initiated Contact with Patient 04/05/17 1706     (approximate)  I have reviewed the triage vital signs and the nursing notes.   HISTORY  Chief Complaint Drug Overdose  History of present illness limited by altered mental status  HPI Anna Perez is a 36 y.o. female With history of depression and lupus who presents with intentional overdose of trazodone and clonazepam.  Per EMS patient took the medications approximately 2:30 PM.  EMS states patient appears to take and 60 trazodone 100 mg pills and 90 clonazepam 1 mg pills.  Per family and patient on the scene this was intentional. Patient unable to give history currently.    Past Medical History:  Diagnosis Date  . Depression   . Lupus     There are no active problems to display for this patient.   No past surgical history on file.  Prior to Admission medications   Medication Sig Start Date End Date Taking? Authorizing Provider  clonazePAM (KLONOPIN) 1 MG tablet Take 1 mg by mouth 3 (three) times daily. 03/24/17  Yes [provider]  estradiol (ESTRACE) 0.5 MG tablet Take 0.5 mg by mouth daily. 03/11/17  Yes [provider]  REXULTI 1 MG TABS Take 1 mg by mouth daily. 02/19/17  Yes [provider]  sertraline (ZOLOFT) 100 MG tablet Take 200 mg by mouth daily. 02/19/17  Yes [provider]  traZODone (DESYREL) 100 MG tablet Take 200 mg by mouth at bedtime. 03/06/17  Yes [provider]    Allergies Patient has no known allergies.  No family history on file.  Social History Social History  Substance Use Topics  . Smoking status: Not on file  . Smokeless tobacco: Not on file  . Alcohol use Not on file    Review of Systems Level V caveat: Unable to obtain ROS due to altered mental status.  ____________________________________________   PHYSICAL  EXAM:  VITAL SIGNS: ED Triage Vitals  Enc Vitals Group     BP 04/05/17 1712 115/62     Pulse Rate 04/05/17 1712 66     Resp 04/05/17 1712 17     Temp 04/05/17 1712 (!) 97.5 F (36.4 C)     Temp Source 04/05/17 1712 Axillary     SpO2 04/05/17 1712 94 %     Weight 04/05/17 1705 139 lb 8 oz (63.3 kg)     Height 04/05/17 1705 5\' 4"  (1.626 m)     Head Circumference --      Peak Flow --      Pain Score 04/05/17 1705 Asleep     Pain Loc --      Pain Edu? --      Excl. in GC? --     Constitutional: Somnolent but opens eyes to voice and follows commands. Eyes: Conjunctivae are normal. EOMI. PERRL.  Head: Atraumatic. Nose: No congestion/rhinnorhea. Mouth/Throat: Mucous membranes are moist.   Neck: Normal range of motion.  Cardiovascular: Normal rate, regular rhythm. Grossly normal heart sounds.  Good peripheral circulation. Respiratory: Normal respiratory effort.  No retractions. Lungs CTAB. Gastrointestinal: Soft and nontender. No distention.  Genitourinary: No CVA tenderness. Musculoskeletal: No lower extremity edema.  Extremities warm and well perfused.  Neurologic:  Moving all extremities.  No gross focal neurologic deficits are appreciated.  Skin:  Skin is warm and dry. No rash noted. Psychiatric: unable to assess.  ____________________________________________   LABS (all labs ordered  are listed, but only abnormal results are displayed)  Labs Reviewed  COMPREHENSIVE METABOLIC PANEL - Abnormal; Notable for the following:       Result Value   Glucose, Bld 104 (*)    Calcium 8.5 (*)    All other components within normal limits  URINE DRUG SCREEN, QUALITATIVE ([facility] ONLY) - Abnormal; Notable for the following:    Benzodiazepine, Ur Scrn POSITIVE (*)    All other components within normal limits  ACETAMINOPHEN LEVEL - Abnormal; Notable for the following:    Acetaminophen (Tylenol), Serum <10 (*)    All other components within normal limits  CBC  ETHANOL  SALICYLATE LEVEL     ____________________________________________  EKG  ED ECG REPORT I, Dionne Bucy, the attending physician, personally viewed and interpreted this ECG.  Date: 04/05/2017 EKG Time: 17:04 Rate: 66 Rhythm: normal sinus rhythm QRS Axis: normal Intervals: Borderline prolonged QTc ST/T Wave abnormalities: normal Narrative Interpretation: no acute findings except for borderline prolonged QTc  ____________________________________________  RADIOLOGY    ____________________________________________   PROCEDURES  Procedure(s) performed: No    Critical Care performed: Yes  CRITICAL CARE Performed by: Dionne Bucy   Total critical care time: 40 minutes  Critical care time was exclusive of separately billable procedures and treating other patients.  Critical care was necessary to treat or prevent imminent or life-threatening deterioration.  Critical care was time spent personally by me on the following activities: development of treatment plan with patient and/or surrogate as well as nursing, discussions with consultants, evaluation of patient's response to treatment, examination of patient, obtaining history from patient or surrogate, ordering and performing treatments and interventions, ordering and review of laboratory studies, ordering and review of radiographic studies, pulse oximetry and re-evaluation of patient's condition.  ____________________________________________   INITIAL IMPRESSION / ASSESSMENT AND PLAN / ED COURSE  Pertinent labs & imaging results that were available during my care of the patient were reviewed by me and considered in my medical decision making (see chart for details).  36 year old female history depression and lupus presents with apparent intentional overdose approximately 3 hours ago of trazodone and clonazepam. She unable to but further history now. On exam, vital signs are normal except for borderline low temp. Patient is  somnolent but arousable. Neuro is nonfocal and there is no evidence of trauma. Patient is maintaining her own airway and adequate respirations. Plan for tox lab workup, cardiac monitoring, poison control consult, and reassess. Patient will need IVC and psych eval when she is more alert and able to give history.   ----------------------------------------- 10:14 PM on 04/05/2017 -----------------------------------------  Patient now more awake. She states that she is supposed to get refills of the medications in 1 week, so she ingested much less than full bottles of each.  Will initiate telepsych and TTS consults.   ____________________________________________   FINAL CLINICAL IMPRESSION(S) / ED DIAGNOSES  Final diagnoses:  Intentional drug overdose, initial encounter (HCC)      NEW MEDICATIONS STARTED DURING THIS VISIT:  New Prescriptions   No medications on file     Note:  This document was prepared using Dragon voice recognition software and may include unintentional dictation errors.    Dionne Bucy, MD 04/06/17 Moses Manners

## 2017-04-05 NOTE — ED Triage Notes (Signed)
Patient presents to the ED via EMS from home, minimally responsive.  Patient was last seen normal at lunch.  Patient told EMS that she took the entire bottle of trazodone and clonazepam which would be 90 1mg  tablets of clonazepam and 60-100mg  tablets of trazodone.  Patient has history of depression and suicide attempt in the past.  Patient's daughter found her unresponsive and called 911.
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
## 2017-04-05 NOTE — ED Notes (Signed)
Pt arousable to touch and verbal command. Pt VS WNL. O2 still in place due to apnea.

## 2017-04-05 NOTE — ED Notes (Signed)
Medications sent to pharmacy at this time. ?

## 2017-04-06 DIAGNOSIS — T424X1A Poisoning by benzodiazepines, accidental (unintentional), initial encounter: Secondary | ICD-10-CM

## 2017-04-06 DIAGNOSIS — F3132 Bipolar disorder, current episode depressed, moderate: Secondary | ICD-10-CM
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
## 2017-04-06 NOTE — ED Notes (Signed)
Assisted pt to toilet to urinate. Pt gait steady.

## 2017-04-06 NOTE — ED Provider Notes (Signed)
-----------------------------------------   7:17 AM on 04/06/2017 -----------------------------------------   Blood pressure 105/65, pulse (!) 57, temperature 98.3 F (36.8 C), temperature source Oral, resp. rate 13, height 5\' 4"  (1.626 m), weight 63.3 kg (139 lb 8 oz), SpO2 94 %.  The patient had no acute events since last update.  Calm and cooperative at this time.  The patient is still awaiting evaluation by psych.     Rebecka Apley, MD 04/06/17 [phone]

## 2017-04-06 NOTE — ED Notes (Signed)
Pt wakens easily by voice. Pt placed on bedpan.

## 2017-04-06 NOTE — ED Provider Notes (Signed)
-----------------------------------------   4:00 PM on 04/06/2017 -----------------------------------------   Blood pressure 104/65, pulse (!) 59, temperature 98.3 F (36.8 C), temperature source Oral, resp. rate 13, height 5\' 4"  (1.626 m), weight 63.3 kg (139 lb 8 oz), SpO2 97 %.  The patient had no acute events since last update.  Calm and cooperative at this time.  Patient is awake and alert at this time and has been evaluated by Dr. Toni Amend he says that the patient does not have any suicidal or homicidal ideation and says that he believes that she is being truthful that she had an accidental overdose of her Klonopin. The patient has psychiatric follow-up in [place]. He is clinically sober at this time and will be discharged home.    Myrna Blazer, MD 04/06/17 [phone]

## 2017-04-19 ENCOUNTER — Encounter: Payer: Self-pay | Admitting: *Deleted

## 2017-07-16 ENCOUNTER — Other Ambulatory Visit: Payer: Self-pay | Admitting: Internal Medicine

## 2018-09-12 ENCOUNTER — Ambulatory Visit
Admission: EM | Admit: 2018-09-12 | Discharge: 2018-09-12 | Disposition: A | Discharge status: Home / Self Care | Payer: Worker's Compensation | Attending: Family Medicine | Admitting: Family Medicine

## 2018-09-12 ENCOUNTER — Ambulatory Visit (INDEPENDENT_AMBULATORY_CARE_PROVIDER_SITE_OTHER): Payer: Worker's Compensation

## 2018-09-12 DIAGNOSIS — H9202 Otalgia, left ear: Secondary | ICD-10-CM | POA: Diagnosis not present

## 2018-09-12 DIAGNOSIS — W228XXA Striking against or struck by other objects, initial encounter: Secondary | ICD-10-CM

## 2018-09-12 DIAGNOSIS — S0512XA Contusion of eyeball and orbital tissues, left eye, initial encounter: Secondary | ICD-10-CM

## 2018-09-12 DIAGNOSIS — R110 Nausea: Secondary | ICD-10-CM | POA: Diagnosis not present

## 2018-09-12 DIAGNOSIS — R51 Headache: Secondary | ICD-10-CM | POA: Diagnosis not present

## 2018-09-12 DIAGNOSIS — R5383 Other fatigue: Secondary | ICD-10-CM

## 2018-09-12 DIAGNOSIS — S0990XA Unspecified injury of head, initial encounter: Secondary | ICD-10-CM

## 2018-09-12 DIAGNOSIS — S0993XA Unspecified injury of face, initial encounter: Secondary | ICD-10-CM

## 2018-09-12 MED ORDER — ONDANSETRON 8 MG PO TBDP: 8.0000 mg | ORAL_TABLET | Freq: Two times a day (BID) | ORAL | 0 refills | Status: AC

## 2018-09-12 NOTE — ED Triage Notes (Signed)
Pt states on Friday at work. Rolling cart of gallon oil hit her in the face. No loss of consciousness. Having nausea, vomiting,l cannot smell or taste anything, headache, fatigue and left ear pain. Also complains of cloudy judgement.

## 2018-09-12 NOTE — ED Provider Notes (Signed)
[facility]    CSN: [id number] Arrival date & time: 09/12/18  1423     History   Chief Complaint Chief Complaint  Patient presents with  . Work Related Injury    HPI Anna Perez is a 38 y.o. female.   HPI  Female accompanied by her daughter presents after she sustained a head injury at work on Friday 2 days prior to this admission.  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  She states that she was rolling  a cart of gallon containers when the front of the cart hit an uneven part of the door sill forcing the cart upwards hitting her in the face mostly on the left side.  No loss of consciousness but did have a stunned feeling at first.  Since  the accident she has been having nausea; she has vomited twice.  Has decreased perception of smell or taste.  She is having headache fatigue and left ear pain.  She is complaining of very cloudy judgment ; having a difficult time with word search at times.  Not had any syncope weakness or loss of function.  He does have an ecchymotic left eye orbital area.`       Past Medical History:  Diagnosis Date  . Anxiety    2014  . Depression    1995  . Depression   . Interstitial cystitis 2007  . Lupus ([facility])   . PTSD (post-traumatic stress disorder)     Patient Active Problem List   Diagnosis Date Noted  . Bipolar 1 disorder, depressed, moderate ([facility]) 04/06/2017  . Benzodiazepine (tranquilizer) overdose, accidental or unintentional, initial encounter 04/06/2017  . Menopausal symptoms 12/09/2016  . Cervical disc disease with myelopathy 10/10/2016  . Depression, major, single episode, moderate ([facility]) 10/10/2016  . Post traumatic stress disorder (PTSD) 10/10/2016  . Panic attacks 10/10/2016  . Interstitial cystitis 10/10/2016  . Disorder of both eustachian tubes 10/10/2016    Past Surgical History:  Procedure Laterality Date  . CESAREAN SECTION     Four  . TOTAL VAGINAL HYSTERECTOMY  2007   abnormal  Pap and ovarian cysts    OB History   No obstetric history on file.      Home Medications    Prior to Admission medications   Medication Sig Start Date End Date Taking? Authorizing Provider  clonazePAM (KLONOPIN) 1 MG tablet Take 1 tablet (1 mg total) by mouth 2 (two) times daily. 12/09/16   Reubin Milan, MD  clonazePAM (KLONOPIN) 1 MG tablet Take 1 mg by mouth 3 (three) times daily. 03/24/17   [provider]  estradiol (ESTRACE) 0.5 MG tablet TAKE ONE TABLET BY MOUTH DAILY 07/16/17   Reubin Milan, MD  meloxicam (MOBIC) 15 MG tablet Take 1 tablet (15 mg total) by mouth daily. 01/21/17   Cuthriell, Delorise Royals, PA-C  ondansetron (ZOFRAN ODT) 8 MG disintegrating tablet Take 1 tablet (8 mg total) by mouth 2 (two) times daily. 09/12/18   Ovid Curd P, PA-C  REXULTI 1 MG TABS Take 1 mg by mouth daily. 02/19/17   [provider]  sertraline (ZOLOFT) 100 MG tablet Take 1 tablet (100 mg total) by mouth 2 (two) times daily. 02/11/17   Reubin Milan, MD  sertraline (ZOLOFT) 100 MG tablet Take 200 mg by mouth daily. 02/19/17   [provider]  traZODone (DESYREL) 100 MG tablet Take 1-2 tablets (100-200 mg total) by mouth at bedtime. 02/11/17  Reubin MilanBerglund, Laura H, MD  traZODone (DESYREL) 100 MG tablet Take 200 mg by mouth at bedtime. 03/06/17   [provider]    Family History Family History  Problem Relation Age of Onset  . Cancer Mother   . Lupus Mother   . Dementia Mother   . Dementia Father   . Lupus Maternal Grandmother   . Diabetes Maternal Grandfather   . Hypertension Maternal Grandfather   . Heart disease Maternal Grandfather   . Diabetes Paternal Grandfather   . Heart disease Paternal Grandfather   . Hypertension Paternal Grandfather     Social History Social History   Tobacco Use  . Smoking status: Current Every Day Smoker  . Smokeless tobacco: Never Used  Substance Use Topics  . Alcohol use: No  . Drug use: No      Allergies   Patient has no known allergies.   Review of Systems Review of Systems  Constitutional: Positive for activity change, appetite change and fatigue. Negative for chills and fever.  HENT: Positive for ear pain and facial swelling.   Neurological: Positive for headaches.  All other systems reviewed and are negative.    Physical Exam Triage Vital Signs ED Triage Vitals  Enc Vitals Group     BP 09/12/18 1448 91/66     Pulse Rate 09/12/18 1448 83     Resp 09/12/18 1448 18     Temp 09/12/18 1448 98.4 F (36.9 C)     Temp Source 09/12/18 1448 Oral     SpO2 09/12/18 1448 98 %     Weight 09/12/18 1450 95 lb (43.1 kg)     Height 09/12/18 1450 4\' 10"  (1.473 m)     Head Circumference --      Peak Flow --      Pain Score 09/12/18 1450 4     Pain Loc --      Pain Edu? --      Excl. in GC? --    No data found.  Updated Vital Signs BP 91/66 (BP Location: Left Arm)   Pulse 83   Temp 98.4 F (36.9 C) (Oral)   Resp 18   Ht 4\' 10"  (1.473 m)   Wt 95 lb (43.1 kg)   SpO2 98%   BMI 19.86 kg/m   Visual Acuity Right Eye Distance:   Left Eye Distance:   Bilateral Distance:    Right Eye Near:   Left Eye Near:    Bilateral Near:     Physical Exam Vitals signs and nursing note reviewed.  Constitutional:      General: She is not in acute distress.    Appearance: Normal appearance. She is normal weight. She is not ill-appearing, toxic-appearing or diaphoretic.  HENT:     Head: Normocephalic.     Comments: No hemotympanum no battle sign does have intraorbital bruising on the left.    Right Ear: Tympanic membrane, ear canal and external ear normal.     Left Ear: Tympanic membrane, ear canal and external ear normal.     Nose: Nose normal.     Mouth/Throat:     Mouth: Mucous membranes are moist.     Pharynx: Oropharynx is clear. No oropharyngeal exudate or posterior oropharyngeal erythema.  Eyes:     General:        Right eye: No discharge.        Left eye: No  discharge.     Comments: Intraorbital bruising present left.  Neck:  Musculoskeletal: Normal range of motion and neck supple. No muscular tenderness.  Musculoskeletal: Normal range of motion.  Lymphadenopathy:     Cervical: No cervical adenopathy.  Skin:    General: Skin is warm and dry.  Neurological:     General: No focal deficit present.     Mental Status: She is alert and oriented to person, place, and time.     Cranial Nerves: No cranial nerve deficit.     Sensory: No sensory deficit.     Motor: No weakness.     Coordination: Coordination normal.     Gait: Gait normal.     Deep Tendon Reflexes: Reflexes normal.  Psychiatric:        Mood and Affect: Mood normal.        Behavior: Behavior normal.        Thought Content: Thought content normal.        Judgment: Judgment normal.      UC Treatments / Results  Labs (all labs ordered are listed, but only abnormal results are displayed) Labs Reviewed - No data to display  EKG None  Radiology Ct Head Wo Contrast  Result Date: 09/12/2018 CLINICAL DATA:  Headache, nausea and left eye pain following blunt trauma to the forehead 2 days ago. She also has left ear pain. EXAM: CT HEAD WITHOUT CONTRAST TECHNIQUE: Contiguous axial images were obtained from the base of the skull through the vertex without intravenous contrast. COMPARISON:  None. FINDINGS: Brain: Normal appearing cerebral hemispheres and posterior fossa structures. Normal size and position of the ventricles. No intracranial hemorrhage, mass lesion or CT evidence of acute infarction. Vascular: No hyperdense vessel or unexpected calcification. Skull: Normal. Negative for fracture or focal lesion. Sinuses/Orbits: Unremarkable. Other: None. IMPRESSION: Normal examination. Electronically Signed   By: Beckie Salts M.D.   On: 09/12/2018 17:04    Procedures Procedures (including critical care time)  Medications Ordered in UC Medications - No data to display  Initial  Impression / Assessment and Plan / UC Course  I have reviewed the triage vital signs and the nursing notes.  Pertinent labs & imaging results that were available during my care of the patient were reviewed by me and considered in my medical decision making (see chart for details).   Reviewed the CAT scan with the patient and her daughter.  Very reassuring.  We will give her Zofran for her nausea.  Give her Tylenol and Motrin for pain.  She was encouraged to drink plenty of fluids.  Follow-up at [facility] with occupational health within a week.   Final Clinical Impressions(s) / UC Diagnoses   Final diagnoses:  Injury of head, initial encounter     Discharge Instructions     Follow-up at [facility] occupational health as noted above.  Scheduled this for 1 week.  If you have any significant changes as explained in the information sheet enclosed go to the emergency room.    ED Prescriptions    Medication Sig Dispense Auth. Provider   ondansetron (ZOFRAN ODT) 8 MG disintegrating tablet Take 1 tablet (8 mg total) by mouth 2 (two) times daily. 6 tablet Lutricia Feil, PA-C     Controlled Substance Prescriptions [place] Controlled Substance Registry consulted? Not Applicable   Lutricia Feil, PA-C 09/12/18 1722

## 2018-09-12 NOTE — Discharge Instructions (Addendum)
Follow-up at [facility] occupational health as noted above.  Scheduled this for 1 week.  If you have any significant changes as explained in the information sheet enclosed go to the emergency room.

## 2020-02-28 ENCOUNTER — Ambulatory Visit: Payer: Self-pay

## 2020-02-28 NOTE — Telephone Encounter (Signed)
Patient called and says she's been having SOB and coughing since last Tuesday. She says she's SOB at rest and worse when moving around. She says she was around chemicals at work and thinks that is what has triggered the asthma flare up. She denies fever, CP. I advised since she has no PCP until next week that she go to the UC for evaluation, she verbalized understanding.  Reason for Disposition . [1] MILD difficulty breathing (e.g., minimal/no SOB at rest, SOB with walking, pulse <100) AND [2] NEW-onset or WORSE than normal  Answer Assessment - Initial Assessment Questions 1. RESPIRATORY STATUS: "Describe your breathing?" (e.g., wheezing, shortness of breath, unable to speak, severe coughing)      Wheezing, SOB, cough 2. ONSET: "When did this breathing problem begin?"      Last Tuesday 3. PATTERN "Does the difficult breathing come and go, or has it been constant since it started?"      Constant 4. SEVERITY: "How bad is your breathing?" (e.g., mild, moderate, severe)    - MILD: No SOB at rest, mild SOB with walking, speaks normally in sentences, can lay down, no retractions, pulse < 100.    - MODERATE: SOB at rest, SOB with minimal exertion and prefers to sit, cannot lie down flat, speaks in phrases, mild retractions, audible wheezing, pulse 100-120.    - SEVERE: Very SOB at rest, speaks in single words, struggling to breathe, sitting hunched forward, retractions, pulse > 120      Moderate 5. RECURRENT SYMPTOM: "Have you had difficulty breathing before?" If Yes, ask: "When was the last time?" and "What happened that time?"      Yes, not this bad 6. CARDIAC HISTORY: "Do you have any history of heart disease?" (e.g., heart attack, angina, bypass surgery, angioplasty)      No 7. LUNG HISTORY: "Do you have any history of lung disease?"  (e.g., pulmonary embolus, asthma, emphysema)     Asthma 8. CAUSE: "What do you think is causing the breathing problem?"      Thinking it's chemical related 9.  OTHER SYMPTOMS: "Do you have any other symptoms? (e.g., dizziness, runny nose, cough, chest pain, fever)     Cough, runny nose 10. PREGNANCY: "Is there any chance you are pregnant?" "When was your last menstrual period?"       No 11. TRAVEL: "Have you traveled out of the country in the last month?" (e.g., travel history, exposures)       No  Protocols used: BREATHING DIFFICULTY-A-AH

## 2020-03-07 ENCOUNTER — Ambulatory Visit: Payer: Self-pay | Admitting: Nurse Practitioner

## 2021-11-03 ENCOUNTER — Encounter: Payer: Self-pay | Admitting: Emergency Medicine

## 2021-11-03 ENCOUNTER — Emergency Department: Payer: Worker's Compensation

## 2021-11-03 ENCOUNTER — Other Ambulatory Visit: Payer: Self-pay

## 2021-11-03 ENCOUNTER — Emergency Department
Admission: EM | Admit: 2021-11-03 | Discharge: 2021-11-03 | Disposition: A | Discharge status: Home / Self Care | Payer: Worker's Compensation | Attending: Emergency Medicine | Admitting: Emergency Medicine

## 2021-11-03 DIAGNOSIS — W208XXA Other cause of strike by thrown, projected or falling object, initial encounter: Secondary | ICD-10-CM | POA: Insufficient documentation

## 2021-11-03 DIAGNOSIS — S6992XA Unspecified injury of left wrist, hand and finger(s), initial encounter: Secondary | ICD-10-CM | POA: Diagnosis present

## 2021-11-03 DIAGNOSIS — Y990 Civilian activity done for income or pay: Secondary | ICD-10-CM | POA: Diagnosis not present

## 2021-11-03 DIAGNOSIS — S62661A Nondisplaced fracture of distal phalanx of left index finger, initial encounter for closed fracture: Secondary | ICD-10-CM | POA: Diagnosis not present

## 2021-11-03 DIAGNOSIS — M79645 Pain in left finger(s): Secondary | ICD-10-CM | POA: Insufficient documentation

## 2021-11-03 MED ORDER — IBUPROFEN 800 MG PO TABS: 800.0000 mg | ORAL_TABLET | Freq: Three times a day (TID) | ORAL | 0 refills | Status: AC | PRN

## 2021-11-03 MED ORDER — OXYCODONE-ACETAMINOPHEN 7.5-325 MG PO TABS: 1.0000 | ORAL_TABLET | Freq: Four times a day (QID) | ORAL | 0 refills | Status: AC | PRN

## 2021-11-03 MED ORDER — LIDOCAINE HCL (PF) 1 % IJ SOLN
5.0000 mL | Freq: Once | INTRAMUSCULAR | Status: AC
  Administered 2021-11-03: 5 mL
  Filled 2021-11-03: qty 5

## 2021-11-03 NOTE — ED Provider Notes (Signed)
? ?[facility] ?Provider Note ? ? ? Event Date/Time  ? First MD Initiated Contact with Patient 11/03/21 1840   ?  (approximate) ? ? ?History  ? ?Hand Pain ? ? ?HPI ?Anna Perez is a 41 y.o. female patient presents with pain and edema to the second and third digit of the left nondominant hand.  Patient states she was at work and a gate fell on her hand.  There is also minor laceration to the left index finger.  Patient past medical history is remarkable for anxiety, depression, and insomnia. ? ? ?Physical Exam  ? ?Triage Vital Signs: ?ED Triage Vitals  ?Enc Vitals Group  ?   BP 11/03/21 1840 (!) 143/96  ?   Pulse Rate 11/03/21 1840 92  ?   Resp 11/03/21 1840 18  ?   Temp 11/03/21 1840 98.7 ?F (37.1 ?C)  ?   Temp Source 11/03/21 1840 Oral  ?   SpO2 11/03/21 1840 96 %  ?   Weight 11/03/21 1835 98 lb (44.5 kg)  ?   Height 11/03/21 1835 4\' 10"  (1.473 m)  ?   Head Circumference --   ?   Peak Flow --   ?   Pain Score 11/03/21 1835 7  ?   Pain Loc --   ?   Pain Edu? --   ?   Excl. in [place]? --   ? ? ?Most recent vital signs: ?Vitals:  ? 11/03/21 1840  ?BP: (!) 143/96  ?Pulse: 92  ?Resp: 18  ?Temp: 98.7 ?F (37.1 ?C)  ?SpO2: 96%  ? ? ?{ ?General: Awake, moderate distress.  ?CV:  Good peripheral perfusion.  ?Resp:  Normal effort.  ?Abd:  No distention.  ?Other:  Edema to the mid and distal phalanx of the second digit right hand.  Small laceration to the dorsal aspect of the second digit left hand ? ? ?ED Results / Procedures / Treatments  ? ?Labs ?(all labs ordered are listed, but only abnormal results are displayed) ?Labs Reviewed - No data to display ? ? ?EKG ? ? ? ? ?RADIOLOGY ?Comminuted intraocular fracture to the mid phalanx second digit left hand. ?Radiology confirmed reading also stating a possible tiny intraocular fracture of the base of the distal second digit left hand. ? ?PROCEDURES: ? ?Critical Care performed: No ? ?Procedures ? ? ?MEDICATIONS ORDERED IN ED: ?Medications  ?lidocaine (PF)  (XYLOCAINE) 1 % injection 5 mL (has no administration in time range)  ?Digital block was applied with 1% lidocaine without epinephrine.  Nurse applied finger splint after cleaning wound. ? ? ?IMPRESSION / MDM / ASSESSMENT AND PLAN / ED COURSE  ?I reviewed the triage vital signs and the nursing notes. ?             ?               ? ?Differential diagnosis includes, but is not limited to, fracture versus contusion to the second digit left hand. ?Patient finger was splinted and small laceration covered with a Band-Aid.  Patient given discharge care instructions and prescription for naproxen and Percocet.  Patient will follow orthopedic by calling for an appointment in the morning. ? ? ?FINAL CLINICAL IMPRESSION(S) / ED DIAGNOSES  ? ?Final diagnoses:  ?Nondisplaced fracture of distal phalanx of left index finger, initial encounter for closed fracture  ? ? ? ?Rx / DC Orders  ? ?ED Discharge Orders   ? ?      Ordered  ?  oxyCODONE-acetaminophen (PERCOCET) 7.5-325 MG tablet  Every 6 hours PRN       ? 11/03/21 1939  ?  ibuprofen (ADVIL) 800 MG tablet  Every 8 hours PRN       ? 11/03/21 1940  ? ?  ?  ? ?  ? ? ? ?Note:  This document was prepared using Dragon voice recognition software and may include unintentional dictation errors. ? ?  ?Sharette, Gehring, PA-C ?11/03/21 1947 ? ?  ?Rada Hay, MD ?11/04/21 2334 ? ?

## 2021-11-03 NOTE — ED Notes (Signed)
Spoke to Production designer, theatre/television/film about worker's comp stating needing [employer] COC form. Manager Nurse, adult) states that we can use our [facility] form.  ?

## 2021-11-03 NOTE — Discharge Instructions (Signed)
Read and follow discharge care instructions.  Wear splint till evaluation by orthopedics.  Call them in the morning and tell them you are a follow-up from the emergency room.  Be advised medication may cause drowsiness. ?

## 2021-11-03 NOTE — ED Triage Notes (Signed)
Pt via POV from home. Per pt, pt was at work and a gate fell on her hand. Pt states L hand is hurting, small lac to the L index finger and swelling noted. Pt is A&OX4 and NAD.  ? ?Pt is worker compensation case.  ?

## 2021-11-03 NOTE — ED Notes (Signed)
Patient's left index finger was splinted per provider order. Discharge instructions provided to patient along with script info and follow-up information. Patient verbalized understanding of all. Patient ambulated with a steady gait out to the waiting room. ?

## 2022-12-01 IMAGING — DX DG HAND COMPLETE 3+V*L*
3 series · 3 of 3 positions shown · non-contrast
Comparison: None.

CLINICAL DATA: hand injury.  Crush injury

EXAM:
LEFT HAND - COMPLETE 3+ VIEW

[hand ap]
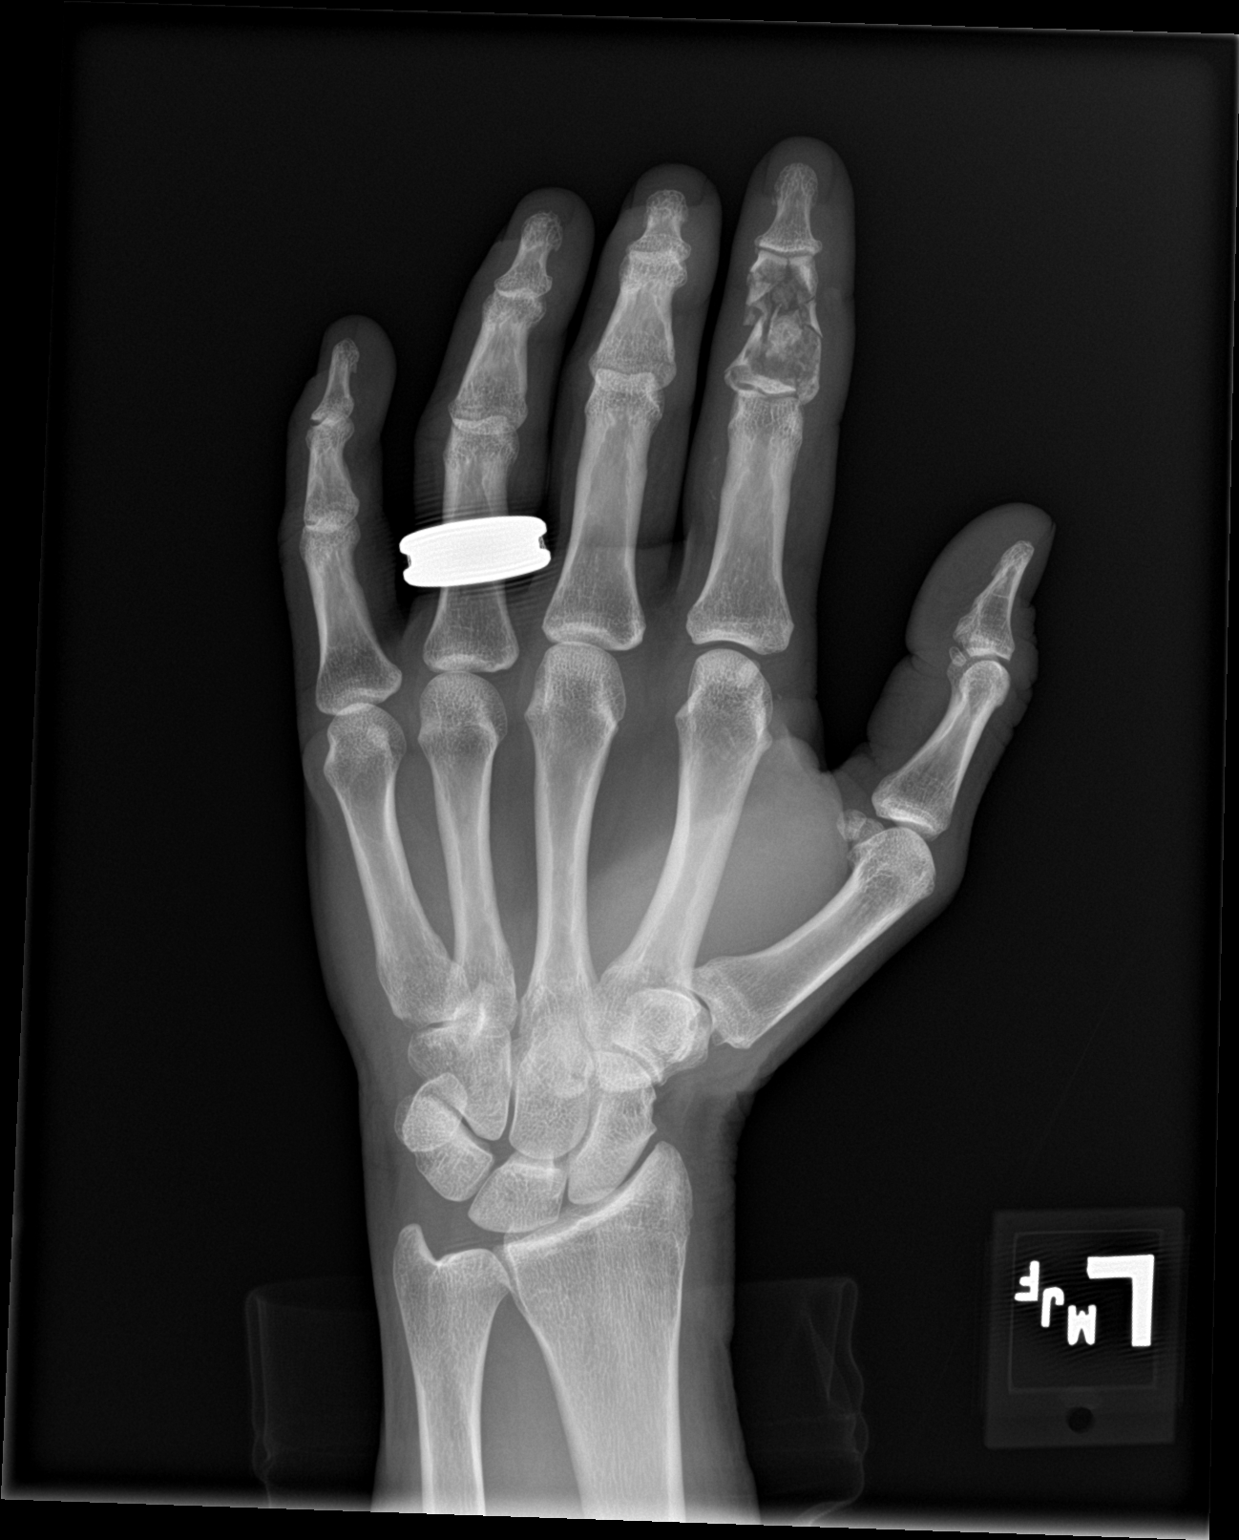

[hand obl]
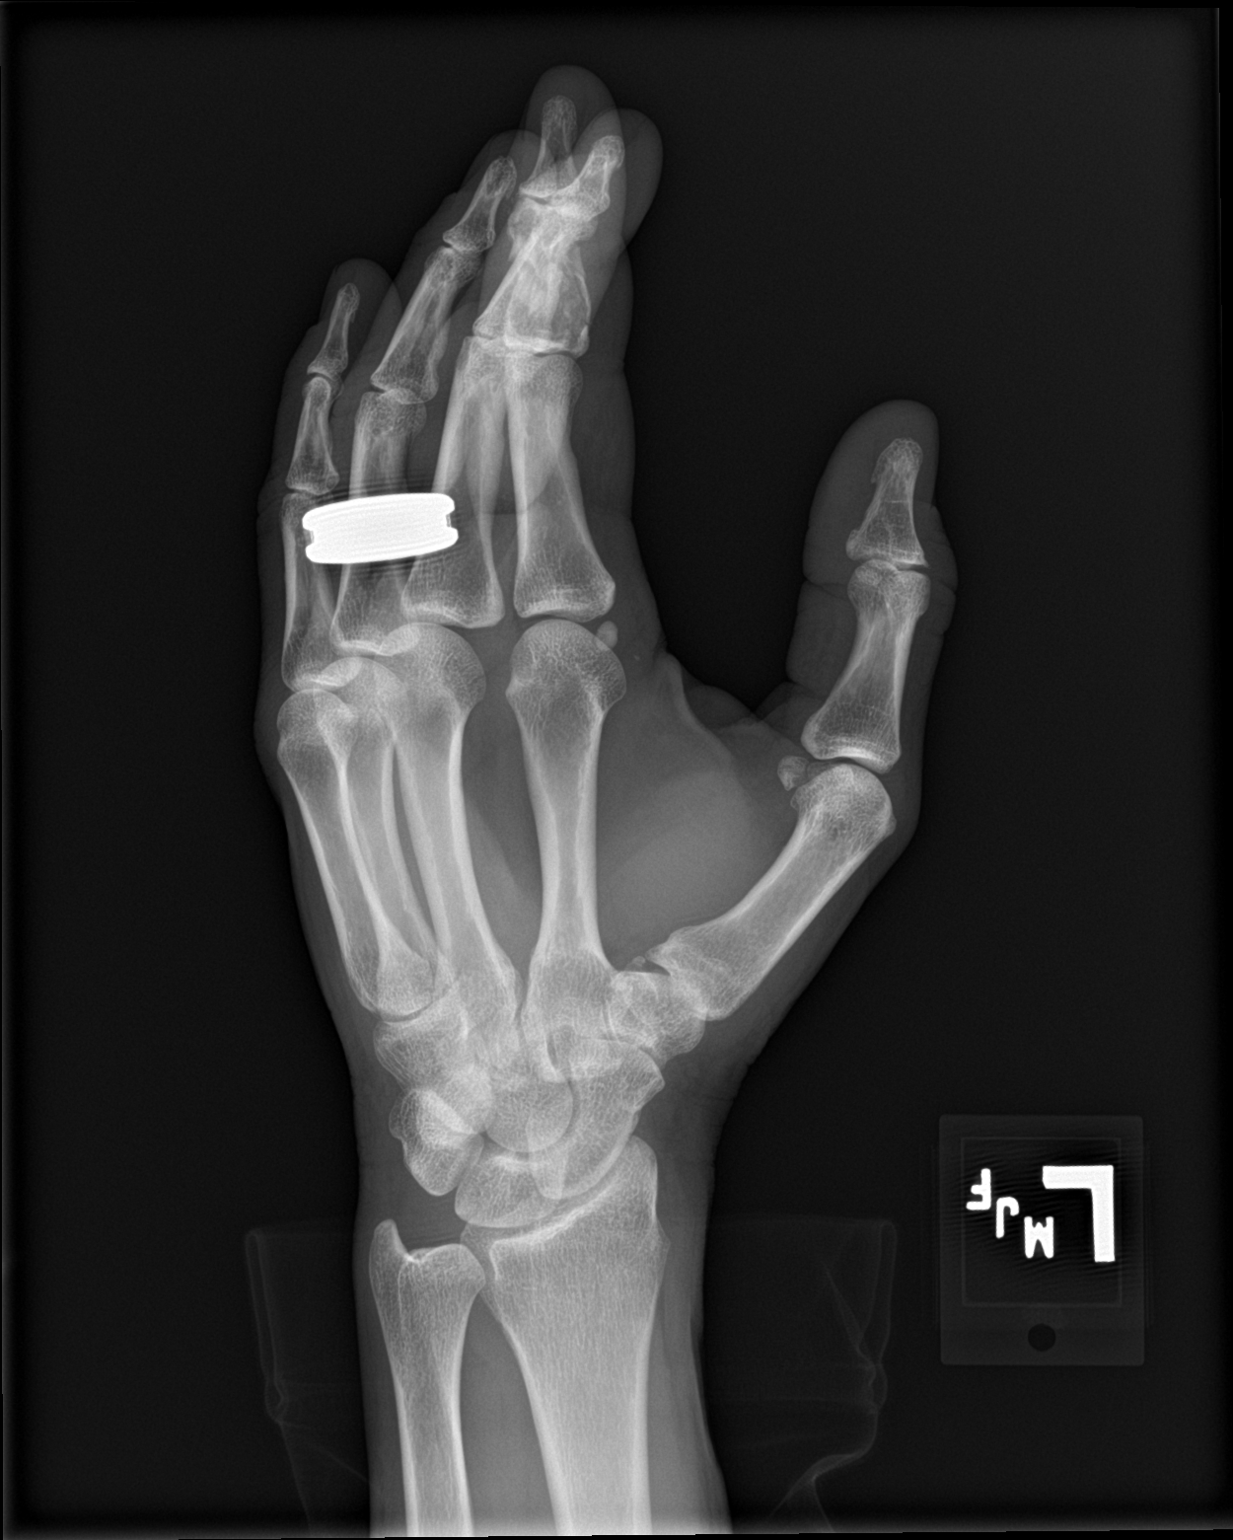

[hand lat]
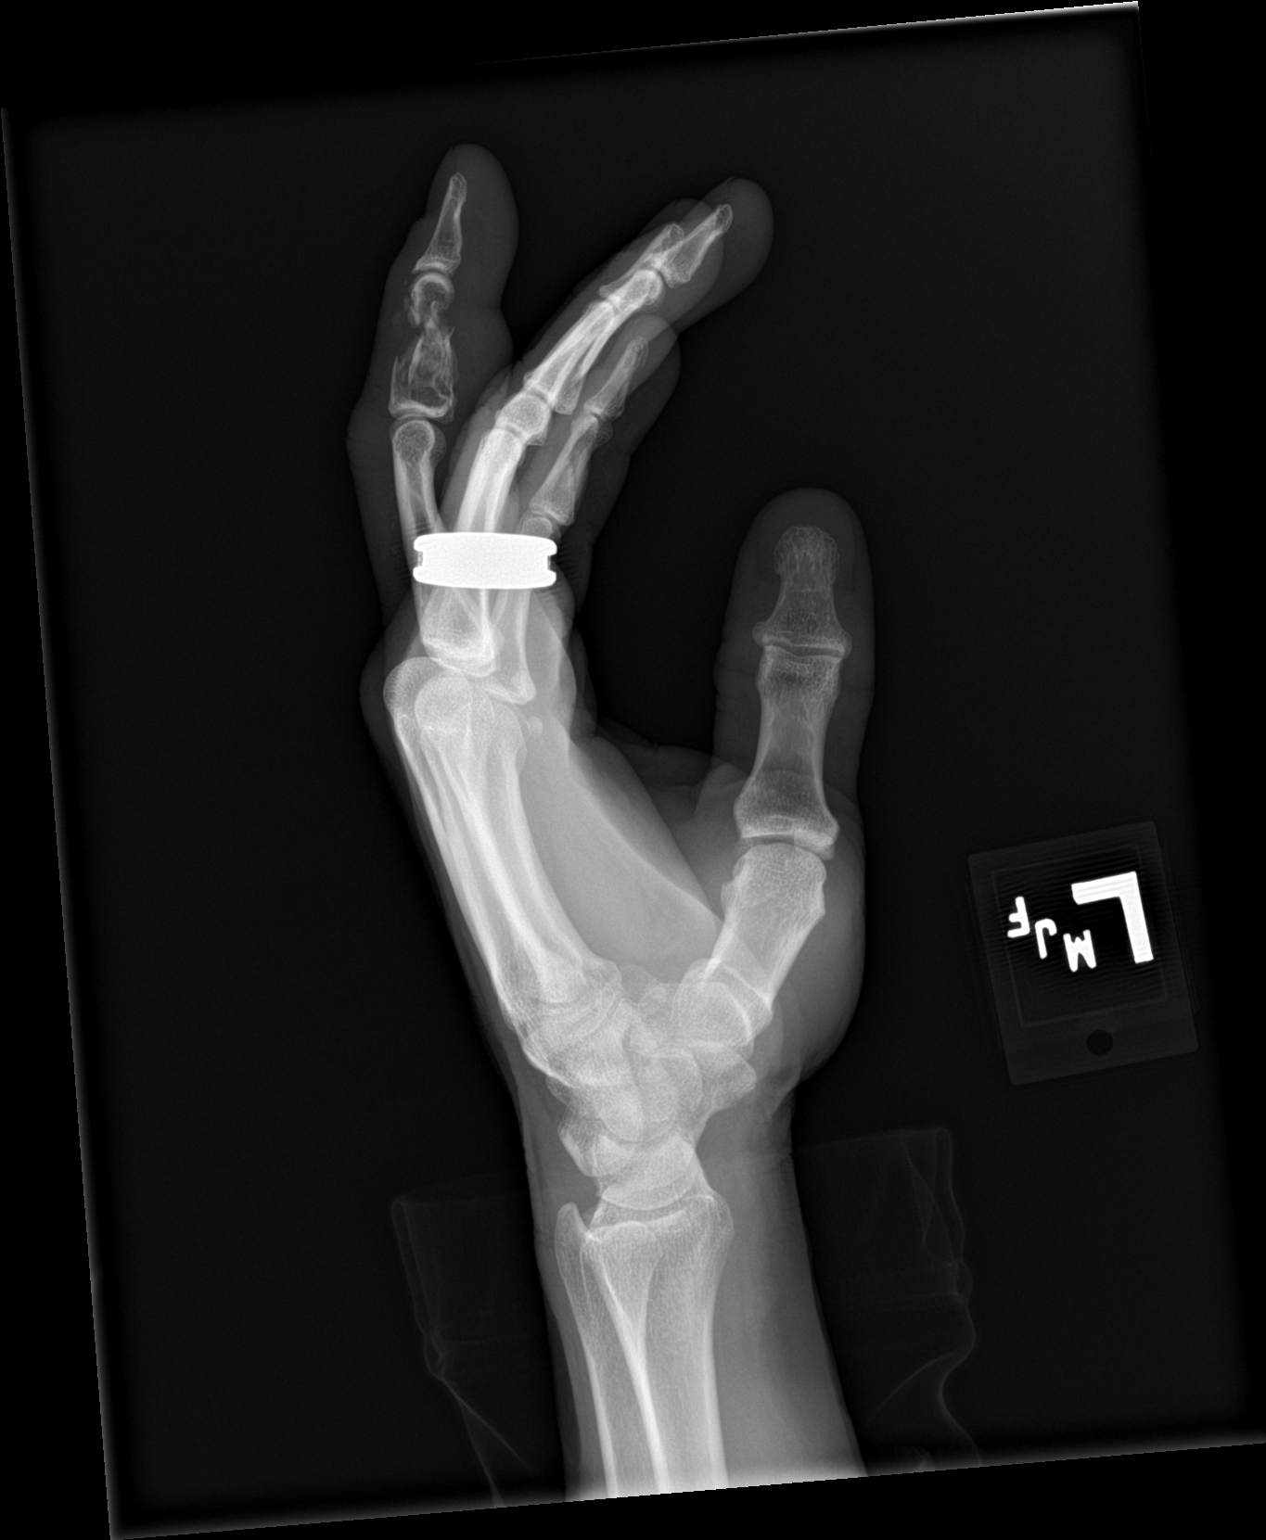

[3 of 3 positions shown; findings below may reference images not displayed]

FINDINGS: Markedly comminuted intra-articular second digit middle phalanx
fracture. Fracture extends both the head and base the phalanx.
Possible tiny intra-articular fracture of the base of the second
digit distal phalanx. Associated subcutaneus soft tissue edema of
the second digit. No retained radiopaque foreign body.
IMPRESSION: 1. Markedly comminuted intra-articular second digit middle phalanx
fracture.
2. Possible tiny intra-articular fracture of the base of the second
digit distal phalanx.
